# Patient Record
Sex: Female | Born: 1995 | Race: White | Hispanic: No | Marital: Single | Smoking: Never smoker
Health system: Southern US, Community
[De-identification: ages and names within clinical notes are randomized; demographics above are authoritative.]

---

## 2019-11-12 ENCOUNTER — Ambulatory Visit
Admission: EM | Admit: 2019-11-12 | Discharge: 2019-11-12 | Disposition: A | Discharge status: Home / Self Care | Payer: BC Managed Care – PPO | Attending: Emergency Medicine | Admitting: Emergency Medicine

## 2019-11-12 ENCOUNTER — Encounter: Payer: Self-pay | Admitting: Emergency Medicine

## 2019-11-12 ENCOUNTER — Other Ambulatory Visit: Payer: Self-pay

## 2019-11-12 ENCOUNTER — Ambulatory Visit (INDEPENDENT_AMBULATORY_CARE_PROVIDER_SITE_OTHER): Payer: BC Managed Care – PPO

## 2019-11-12 DIAGNOSIS — S8392XA Sprain of unspecified site of left knee, initial encounter: Secondary | ICD-10-CM

## 2019-11-12 DIAGNOSIS — Y9301 Activity, walking, marching and hiking: Secondary | ICD-10-CM

## 2019-11-12 DIAGNOSIS — W010XXA Fall on same level from slipping, tripping and stumbling without subsequent striking against object, initial encounter: Secondary | ICD-10-CM

## 2019-11-12 NOTE — ED Provider Notes (Signed)
[facility]    CSN: [id number] Arrival date & time: 11/12/19  0915      History   Chief Complaint Chief Complaint  Patient presents with  . Knee Pain    HPI Kayla Martin is a 23 y.o. female presenting for left knee pain, swelling since yesterday.  States she went on a 4 mile hike yesterday, tripped and fell landing on her left knee and hip.  Denies head trauma, LOC.  Patient denies hip pain today, though does report persistent knee pain, swelling despite icing, OTC analgesia.  Patient states pain is worse with full extension and weightbearing.   History reviewed. No pertinent past medical history.  There are no active problems to display for this patient.   History reviewed. No pertinent surgical history.  OB History   No obstetric history on file.      Home Medications    Prior to Admission medications   Not on File    Family History Family History  Problem Relation Age of Onset  . Healthy Mother   . Healthy Father     Social History Social History   Tobacco Use  . Smoking status: Never Smoker  . Smokeless tobacco: Never Used  Substance Use Topics  . Alcohol use: Yes    Comment: most days  . Drug use: Never     Allergies   Patient has no known allergies.   Review of Systems Review of Systems  Constitutional: Negative for fatigue and fever.  Respiratory: Negative for cough and shortness of breath.   Cardiovascular: Negative for chest pain and palpitations.  Musculoskeletal:       Positive for left knee pain, swelling  Neurological: Negative for weakness and numbness.     Physical Exam Triage Vital Signs ED Triage Vitals [11/12/19 0929]  Enc Vitals Group     BP 106/73     Pulse Rate 74     Resp 16     Temp 99 F (37.2 C)     Temp Source Oral     SpO2 98 %     Weight      Height      Head Circumference      Peak Flow      Pain Score 7     Pain Loc      Pain Edu?      Excl. in GC?    No data found.  Updated  Vital Signs BP 106/73 (BP Location: Left Arm)   Pulse 74   Temp 99 F (37.2 C) (Oral)   Resp 16   LMP 11/01/2019   SpO2 98%   Visual Acuity Right Eye Distance:   Left Eye Distance:   Bilateral Distance:    Right Eye Near:   Left Eye Near:    Bilateral Near:     Physical Exam Constitutional:      General: She is not in acute distress. HENT:     Head: Normocephalic and atraumatic.  Eyes:     General: No scleral icterus.    Pupils: Pupils are equal, round, and reactive to light.  Cardiovascular:     Rate and Rhythm: Normal rate.  Pulmonary:     Effort: Pulmonary effort is normal.  Musculoskeletal:     Left knee: She exhibits decreased range of motion, swelling and ecchymosis. She exhibits no effusion, no deformity, no laceration, no erythema, normal alignment, no LCL laxity and normal patellar mobility. Tenderness found. Patellar tendon tenderness noted.  Comments: Patellar TTP.  Strength deferred.  Gait antalgic  Skin:    Coloration: Skin is not jaundiced or pale.  Neurological:     Mental Status: She is alert and oriented to person, place, and time.      UC Treatments / Results  Labs (all labs ordered are listed, but only abnormal results are displayed) Labs Reviewed - No data to display  EKG   Radiology Dg Knee Ap/lat W/sunrise Left  Result Date: 11/12/2019 CLINICAL DATA:  Left knee pain after fall yesterday EXAM: LEFT KNEE 3 VIEWS COMPARISON:  None. FINDINGS: No evidence of fracture, dislocation, or joint effusion. No evidence of arthropathy or other focal bone abnormality. Soft tissues are unremarkable. IMPRESSION: No left knee fracture, joint effusion or dislocation. Electronically Signed   By: Ilona Sorrel M.D.   On: 11/12/2019 10:11    Procedures Procedures (including critical care time)  Medications Ordered in UC Medications - No data to display  Initial Impression / Assessment and Plan / UC Course  I have reviewed the triage vital signs and the  nursing notes.  Pertinent labs & imaging results that were available during my care of the patient were reviewed by me and considered in my medical decision making (see chart for details).     Left knee x-ray done in office, reviewed by me radiology: Negative for fracture, effusion, dislocation.  Discussed that x-ray is limited in viewing tendons/ligaments: Patient verbalized understanding.  Reviewed RICE protocol, provided knee rehab exercises as well as orthopedic contact information.  Recommended 1 week follow-up.  Patient has crutches at home, will use these to help her ambulate.  Patient also has Ace wrap at home that she will use for compression.  Return precautions discussed, patient verbalized understanding and is agreeable to plan. Final Clinical Impressions(s) / UC Diagnoses   Final diagnoses:  Sprain of left knee, unspecified ligament, initial encounter     Discharge Instructions     Recommend RICE: rest, ice, compression, elevation as needed for pain.    For pain: recommend 350 mg-1000 mg of Tylenol (acetaminophen) and/or 200 mg - 800 mg of Advil (ibuprofen, Motrin) every 8 hours as needed.  May alternate between the two throughout the day as they are generally safe to take together.  DO NOT exceed more than 3000 mg of Tylenol or 3200 mg of ibuprofen in a 24 hour period as this could damage your stomach, kidneys, liver, or increase your bleeding risk.    ED Prescriptions    None     PDMP not reviewed this encounter.   [place], [place], [place] 11/12/19 1048

## 2019-11-12 NOTE — ED Notes (Signed)
Pt refused knee brace at this time

## 2019-11-12 NOTE — ED Triage Notes (Signed)
Pt presents to [facility] for assessment after her "leg gave out" while hiking down hill yesterday during a 4 mile hike, and states she went down and landed on her knee and hip.  C/o continued left knee pain, with swelling, can't extend it fully, and mild bruising.  States she has pictures of it from last night if the provider wants to see.  Unable to bear full weight.

## 2019-11-12 NOTE — ED Notes (Signed)
Patient able to ambulate independently  

## 2019-11-12 NOTE — Discharge Instructions (Addendum)
Recommend RICE: rest, ice, compression, elevation as needed for pain.   ° °For pain: recommend 350 mg-1000 mg of Tylenol (acetaminophen) and/or 200 mg - 800 mg of Advil (ibuprofen, Motrin) every 8 hours as needed.  May alternate between the two throughout the day as they are generally safe to take together.  DO NOT exceed more than 3000 mg of Tylenol or 3200 mg of ibuprofen in a 24 hour period as this could damage your stomach, kidneys, liver, or increase your bleeding risk. °

## 2020-04-29 IMAGING — DX DG KNEE AP/LAT W/ SUNRISE*L*
3 series · 3 of 3 positions shown · non-contrast
Comparison: None.

CLINICAL DATA: Left knee pain after fall yesterday

EXAM:
LEFT KNEE 3 VIEWS

[knee ap]
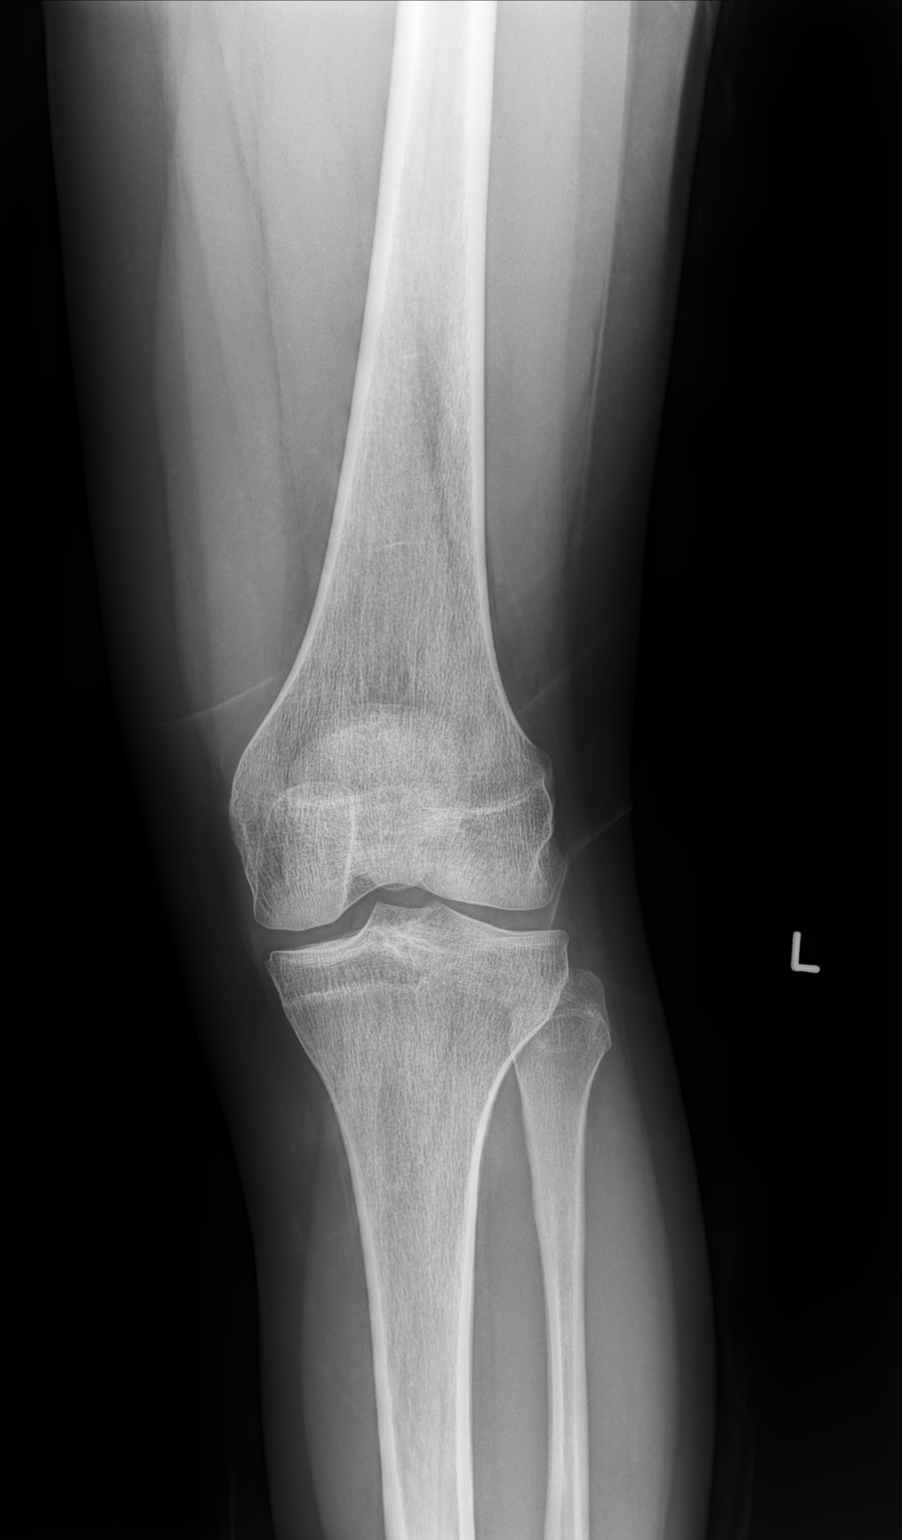

[knee lat]
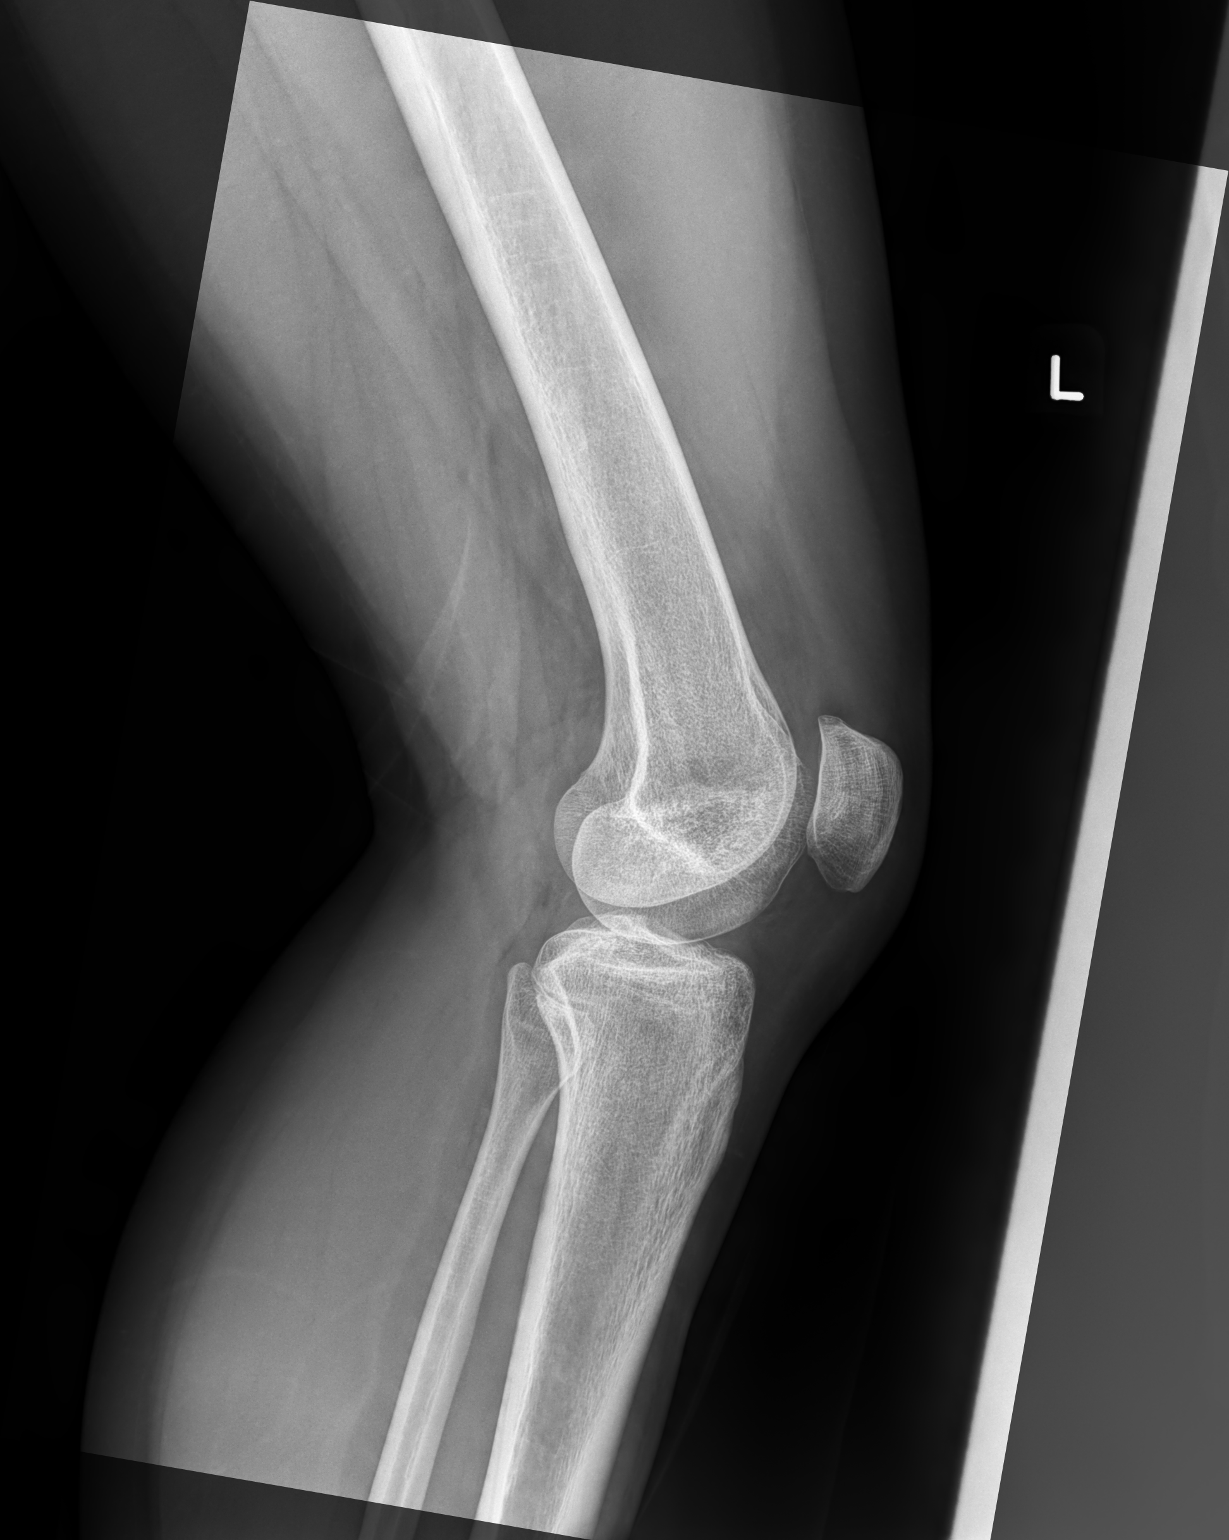

[patella [id]]
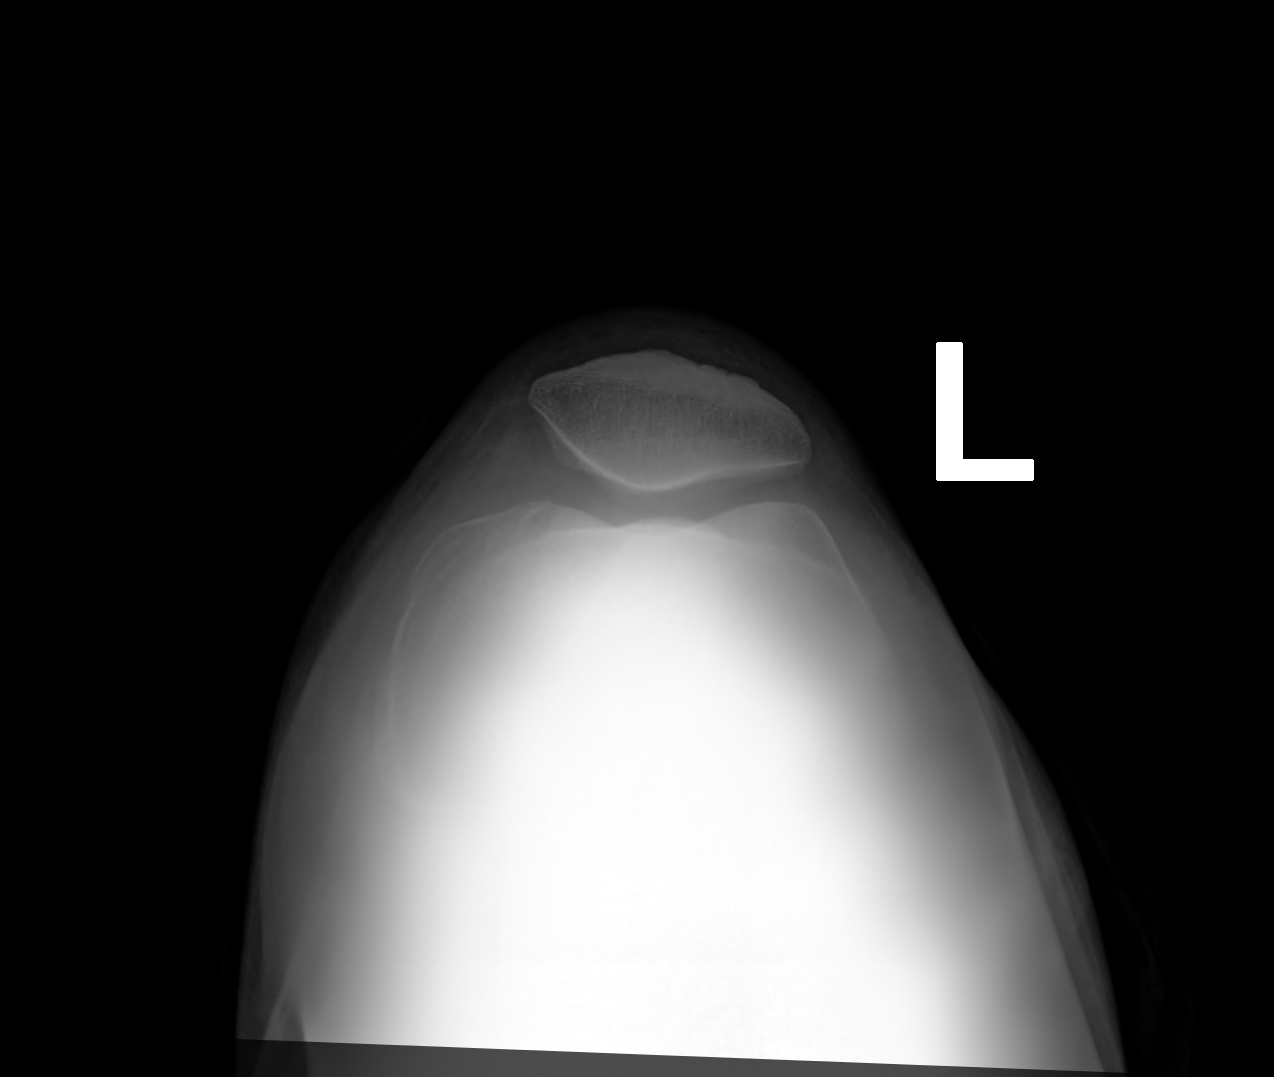

[3 of 3 positions shown; findings below may reference images not displayed]

FINDINGS: No evidence of fracture, dislocation, or joint effusion. No evidence
of arthropathy or other focal bone abnormality. Soft tissues are
unremarkable.
IMPRESSION: No left knee fracture, joint effusion or dislocation.
